# Patient Record
Sex: Female | Born: 1980 | Race: Black or African American | Hispanic: No | Marital: Married | Smoking: Current every day smoker
Health system: Southern US, Community
[De-identification: ages and names within clinical notes are randomized; demographics above are authoritative.]

## PROBLEM LIST (undated history)

## (undated) DIAGNOSIS — I10 Essential (primary) hypertension: Secondary | ICD-10-CM

## (undated) HISTORY — PX: ANKLE SURGERY: SHX546

## (undated) HISTORY — PX: ABDOMINAL HYSTERECTOMY: SHX81

---

## 2008-04-25 ENCOUNTER — Emergency Department (HOSPITAL_BASED_OUTPATIENT_CLINIC_OR_DEPARTMENT_OTHER): Admission: EM | Admit: 2008-04-25 | Discharge: 2008-04-25 | Payer: Self-pay | Admitting: Emergency Medicine

## 2009-01-07 ENCOUNTER — Emergency Department (HOSPITAL_BASED_OUTPATIENT_CLINIC_OR_DEPARTMENT_OTHER): Admission: EM | Admit: 2009-01-07 | Discharge: 2009-01-07 | Payer: Self-pay | Admitting: Emergency Medicine

## 2009-01-07 ENCOUNTER — Ambulatory Visit: Payer: Self-pay | Admitting: Radiology

## 2009-11-16 ENCOUNTER — Emergency Department (HOSPITAL_BASED_OUTPATIENT_CLINIC_OR_DEPARTMENT_OTHER): Admission: EM | Admit: 2009-11-16 | Discharge: 2009-11-16 | Payer: Self-pay | Admitting: Emergency Medicine

## 2009-11-16 ENCOUNTER — Ambulatory Visit: Payer: Self-pay | Admitting: Interventional Radiology

## 2010-07-26 LAB — URINALYSIS, ROUTINE W REFLEX MICROSCOPIC
Bilirubin Urine: NEGATIVE
Glucose, UA: NEGATIVE mg/dL
Protein, ur: NEGATIVE mg/dL
Specific Gravity, Urine: 1.019 (ref 1.005–1.030)
pH: 7 (ref 5.0–8.0)

## 2011-03-27 ENCOUNTER — Emergency Department (HOSPITAL_BASED_OUTPATIENT_CLINIC_OR_DEPARTMENT_OTHER)
Admission: EM | Admit: 2011-03-27 | Discharge: 2011-03-27 | Disposition: A | Discharge status: Home / Self Care | Payer: 59 | Attending: Emergency Medicine | Admitting: Emergency Medicine

## 2011-03-27 ENCOUNTER — Encounter: Payer: Self-pay | Admitting: *Deleted

## 2011-03-27 DIAGNOSIS — R51 Headache: Secondary | ICD-10-CM | POA: Insufficient documentation

## 2011-03-27 DIAGNOSIS — R110 Nausea: Secondary | ICD-10-CM

## 2011-03-27 HISTORY — DX: Essential (primary) hypertension: I10

## 2011-03-27 LAB — URINALYSIS, ROUTINE W REFLEX MICROSCOPIC
Bilirubin Urine: NEGATIVE
Hgb urine dipstick: NEGATIVE
Leukocytes, UA: NEGATIVE
Nitrite: NEGATIVE
Urobilinogen, UA: 0.2 mg/dL (ref 0.0–1.0)
pH: 7.5 (ref 5.0–8.0)

## 2011-03-27 LAB — BASIC METABOLIC PANEL
BUN: 10 mg/dL (ref 6–23)
Chloride: 99 mEq/L (ref 96–112)
Creatinine, Ser: 0.8 mg/dL (ref 0.50–1.10)
GFR calc Af Amer: >90 mL/min (ref >90)
GFR calc non Af Amer: >90 mL/min (ref >90)
Glucose, Bld: 87 mg/dL (ref 70–99)

## 2011-03-27 LAB — PREGNANCY, URINE: Preg Test, Ur: NEGATIVE

## 2011-03-27 MED ORDER — ONDANSETRON HCL 4 MG/2ML IJ SOLN
4.0000 mg | Freq: Once | INTRAMUSCULAR | Status: AC
  Administered 2011-03-27: 4 mg via INTRAVENOUS
  Filled 2011-03-27: qty 2

## 2011-03-27 MED ORDER — KETOROLAC TROMETHAMINE 30 MG/ML IJ SOLN
30.0000 mg | Freq: Once | INTRAMUSCULAR | Status: AC
  Administered 2011-03-27: 30 mg via INTRAVENOUS
  Filled 2011-03-27: qty 1

## 2011-03-27 MED ORDER — SODIUM CHLORIDE 0.9 % IV BOLUS (SEPSIS)
1000.0000 mL | Freq: Once | INTRAVENOUS | Status: AC
  Administered 2011-03-27: 1000 mL via INTRAVENOUS

## 2011-03-27 NOTE — ED Provider Notes (Signed)
Medical screening examination/treatment/procedure(s) were performed by non-physician practitioner and as supervising physician I was immediately available for consultation/collaboration.   Toy Baker, MD 03/27/11 [phone]

## 2011-03-27 NOTE — ED Provider Notes (Signed)
History     CSN: [id number] Arrival date & time: 03/27/2011  4:27 PM   First MD Initiated Contact with Patient 03/27/11 1654      Chief Complaint  Patient presents with  . Headache    (Consider location/radiation/quality/duration/timing/severity/associated sxs/prior treatment) HPI Comments: Pt state that she started with nauseas and feeling bad 7 days ago:pt states that she was diagnosed with a stomach virus and she just isn't feeling better:pt state that she started with the headache yesterday  Patient is a 30 y.o. female presenting with headaches. The history is provided by the patient. No language interpreter was used.  Headache  This is a new problem. The current episode started yesterday. The problem occurs constantly. The problem has not changed since onset.The headache is associated with nothing. The pain is located in the temporal region. The pain is moderate. The pain does not radiate. Associated symptoms include nausea and vomiting. Pertinent negatives include no fever. The treatment provided no relief.    Past Medical History  Diagnosis Date  . Hypertension     History reviewed. No pertinent past surgical history.  History reviewed. No pertinent family history.  History  Substance Use Topics  . Smoking status: Never Smoker   . Smokeless tobacco: Not on file  . Alcohol Use: No    OB History    Grav Para Term Preterm Abortions TAB SAB Ect Mult Living                  Review of Systems  Constitutional: Negative for fever.  Gastrointestinal: Positive for nausea and vomiting.  Neurological: Positive for headaches.  All other systems reviewed and are negative.    Allergies  Review of patient's allergies indicates no known allergies.  Home Medications   Current Outpatient Rx  Name Route Sig Dispense Refill  . HYDROCHLOROTHIAZIDE 12.5 MG PO CAPS Oral Take 12.5 mg by mouth daily.      . IBUPROFEN 200 MG PO TABS Oral Take 400 mg by mouth every 6 (six)  hours as needed. For headache      . METOPROLOL SUCCINATE ER 25 MG PO TB24 Oral Take 25 mg by mouth daily.      Marland Kitchen NISOLDIPINE 17 MG PO TB24 Oral Take 17 mg by mouth daily.      Marland Kitchen POTASSIUM GLUCONATE PO Oral Take 1 tablet by mouth daily.      Marland Kitchen PROMETHAZINE HCL 25 MG PO TABS Oral Take 25 mg by mouth every 6 (six) hours as needed. For nausea        BP 126/80  Pulse 94  Temp(Src) 98.8 F (37.1 C) (Oral)  Resp 16  Ht 5\' 9"  (1.753 m)  Wt 181 lb (82.101 kg)  BMI 26.73 kg/m2  SpO2 100%  LMP 03/14/2011  Physical Exam  Nursing note and vitals reviewed. Constitutional: She is oriented to person, place, and time. She appears well-developed and well-nourished.  HENT:  Head: Normocephalic and atraumatic.  Neck: Normal range of motion.  Cardiovascular: Normal rate and regular rhythm.   Pulmonary/Chest: Effort normal and breath sounds normal.  Musculoskeletal: Normal range of motion.  Neurological: She is alert and oriented to person, place, and time.  Skin: Skin is warm and dry.    ED Course  Procedures (including critical care time)  Labs Reviewed  URINALYSIS, ROUTINE W REFLEX MICROSCOPIC - Abnormal; Notable for the following:    Ketones, ur 40 (*)    All other components within normal limits  PREGNANCY, URINE  BASIC METABOLIC PANEL   No results found.   1. Headache   2. Nausea       MDM  Pt is feeling better at this time        Teressa Lower, NP 03/27/11 1901

## 2011-03-27 NOTE — ED Notes (Signed)
Pt states she has been sick since last Monday. Seen at [facility] ED and dx'd with stomach virus. No better. C/O headache, N/V. PERL

## 2011-04-13 ENCOUNTER — Emergency Department (HOSPITAL_BASED_OUTPATIENT_CLINIC_OR_DEPARTMENT_OTHER)
Admission: EM | Admit: 2011-04-13 | Discharge: 2011-04-13 | Disposition: A | Discharge status: Home / Self Care | Payer: 59 | Attending: Emergency Medicine | Admitting: Emergency Medicine

## 2011-04-13 ENCOUNTER — Encounter (HOSPITAL_BASED_OUTPATIENT_CLINIC_OR_DEPARTMENT_OTHER): Payer: Self-pay | Admitting: *Deleted

## 2011-04-13 DIAGNOSIS — M549 Dorsalgia, unspecified: Secondary | ICD-10-CM | POA: Insufficient documentation

## 2011-04-13 DIAGNOSIS — Z79899 Other long term (current) drug therapy: Secondary | ICD-10-CM | POA: Insufficient documentation

## 2011-04-13 DIAGNOSIS — I10 Essential (primary) hypertension: Secondary | ICD-10-CM | POA: Insufficient documentation

## 2011-04-13 MED ORDER — CYCLOBENZAPRINE HCL 5 MG PO TABS: 5.0000 mg | ORAL_TABLET | Freq: Two times a day (BID) | ORAL | Status: AC | PRN

## 2011-04-13 NOTE — ED Provider Notes (Signed)
History     CSN: [id number]  Arrival date & time 04/13/11  1659   First MD Initiated Contact with Patient 04/13/11 1841      Chief Complaint  Patient presents with  . Back Pain    (Consider location/radiation/quality/duration/timing/severity/associated sxs/prior treatment) Patient is a 31 y.o. female presenting with back pain. The history is provided by the patient. No language interpreter was used.  Back Pain  This is a new problem. The current episode started yesterday. The problem occurs constantly. The problem has not changed since onset.The pain is associated with no known injury. The pain is present in the thoracic spine. The quality of the pain is described as aching. The pain does not radiate. The pain is moderate. The symptoms are aggravated by certain positions. The pain is the same all the time. Pertinent negatives include no numbness, no bowel incontinence, no perianal numbness, no bladder incontinence, no tingling and no weakness. She has tried nothing for the symptoms.    Past Medical History  Diagnosis Date  . Hypertension     History reviewed. No pertinent past surgical history.  History reviewed. No pertinent family history.  History  Substance Use Topics  . Smoking status: Never Smoker   . Smokeless tobacco: Not on file  . Alcohol Use: No    OB History    Grav Para Term Preterm Abortions TAB SAB Ect Mult Living                  Review of Systems  Gastrointestinal: Negative for bowel incontinence.  Genitourinary: Negative for bladder incontinence.  Musculoskeletal: Positive for back pain.  Neurological: Negative for tingling, weakness and numbness.  All other systems reviewed and are negative.    Allergies  Review of patient's allergies indicates no known allergies.  Home Medications   Current Outpatient Rx  Name Route Sig Dispense Refill  . HYDROCHLOROTHIAZIDE 12.5 MG PO CAPS Oral Take 12.5 mg by mouth daily.      Marland Kitchen METOPROLOL SUCCINATE ER  25 MG PO TB24 Oral Take 25 mg by mouth daily.      Marland Kitchen NISOLDIPINE ER 17 MG PO TB24 Oral Take 17 mg by mouth daily.      Marland Kitchen POTASSIUM CHLORIDE ER 10 MEQ PO TBCR Oral Take 10 mEq by mouth daily.      . SERTRALINE HCL 50 MG PO TABS Oral Take 25 mg by mouth daily.      Marland Kitchen ZOLOFT PO Oral Take 0.5 tablets by mouth daily.      . CYCLOBENZAPRINE HCL 5 MG PO TABS Oral Take 1 tablet (5 mg total) by mouth 2 (two) times daily as needed for muscle spasms. 20 tablet 0    BP 136/79  Pulse 89  Temp(Src) 98.2 F (36.8 C) (Oral)  Resp 18  Ht 5\' 9"  (1.753 m)  Wt 181 lb (82.101 kg)  BMI 26.73 kg/m2  SpO2 100%  LMP 03/14/2011  Physical Exam  Nursing note reviewed. Constitutional: She is oriented to person, place, and time. She appears well-developed and well-nourished.  HENT:  Head: Normocephalic and atraumatic.  Cardiovascular: Normal rate and regular rhythm.   Pulmonary/Chest: Effort normal and breath sounds normal.  Abdominal: Soft.  Musculoskeletal: Normal range of motion.       Pt tender along the right scaphoid  Neurological: She is alert and oriented to person, place, and time.  Skin: Skin is warm and dry.  Psychiatric: She has a normal mood and affect.    ED  Course  Procedures (including critical care time)  Labs Reviewed - No data to display No results found.   1. Back pain       MDM  Pt tender with palpation and movement likely muscle strain:no concerned for respiratory infection        Teressa Lower, NP 04/13/11 1859

## 2011-04-13 NOTE — ED Notes (Signed)
Upper left back pain onset yesterday

## 2011-04-13 NOTE — ED Provider Notes (Signed)
Medical screening examination/treatment/procedure(s) were performed by non-physician practitioner and as supervising physician I was immediately available for consultation/collaboration.   Leelan Rajewski, MD 04/13/11 2306 

## 2015-11-09 ENCOUNTER — Emergency Department (INDEPENDENT_AMBULATORY_CARE_PROVIDER_SITE_OTHER)
Admission: EM | Admit: 2015-11-09 | Discharge: 2015-11-09 | Disposition: A | Discharge status: Home / Self Care | Payer: 59 | Source: Home / Self Care | Attending: Emergency Medicine | Admitting: Emergency Medicine

## 2015-11-09 ENCOUNTER — Encounter: Payer: Self-pay | Admitting: *Deleted

## 2015-11-09 ENCOUNTER — Emergency Department (INDEPENDENT_AMBULATORY_CARE_PROVIDER_SITE_OTHER): Payer: 59

## 2015-11-09 DIAGNOSIS — S93402A Sprain of unspecified ligament of left ankle, initial encounter: Secondary | ICD-10-CM | POA: Diagnosis not present

## 2015-11-09 DIAGNOSIS — M25472 Effusion, left ankle: Secondary | ICD-10-CM

## 2015-11-09 MED ORDER — MELOXICAM 15 MG PO TABS: 15.0000 mg | ORAL_TABLET | Freq: Every day | ORAL | 1 refills | Status: DC

## 2015-11-09 MED ORDER — HYDROCODONE-ACETAMINOPHEN 5-325 MG PO TABS: 1.0000 | ORAL_TABLET | Freq: Four times a day (QID) | ORAL | 0 refills | Status: DC | PRN

## 2015-11-09 NOTE — ED Triage Notes (Signed)
Pt c/o LT ankle pain and swelling x 2 wks after rolling her ankle after she stepped on a lego at work. She has taken IBF, applied ice and warm epsom salt soaks without relief.

## 2015-11-09 NOTE — ED Provider Notes (Signed)
[facility]    CSN: [id number] Arrival date & time: 11/09/15  1650  First Provider Contact:  None       History   Chief Complaint Chief Complaint  Patient presents with  . Ankle Pain    HPI Kristen Khan is a 35 y.o. female.   HPI 2 weeks ago, accidentally twisted her left ankle and since then has had moderate to severe pain and swelling left medial ankle without radiation. Very difficult to weight bear. No paresthesias. She has not sought medical attention until now and has been limping around since then, hoping it would improve on its own. No other associated symptoms. No chest pain or shortness of breath or fever or chills or nausea or vomiting or abdominal pain. Pertinent past medical history: Fractured this left ankle 2005, surgically repaired with metallic pins and that went well and she was asymptomatic until now Past Medical History:  Diagnosis Date  . Hypertension     There are no active problems to display for this patient.   Past Surgical History:  Procedure Laterality Date  . ABDOMINAL HYSTERECTOMY    . ANKLE SURGERY Left     OB History    No data available       Home Medications    Prior to Admission medications   Medication Sig Start Date End Date Taking? Authorizing Provider  amLODipine (NORVASC) 5 MG tablet Take 5 mg by mouth daily.   Yes Historical Provider, MD  citalopram (CELEXA) 40 MG tablet Take 40 mg by mouth daily.   Yes Historical Provider, MD  HYDROcodone-acetaminophen (NORCO/VICODIN) 5-325 MG tablet Take 1-2 tablets by mouth every 6 (six) hours as needed for severe pain. Take with food. Caution: May cause drowsiness 11/09/15   Lajean Manes, MD  meloxicam (MOBIC) 15 MG tablet Take 1 tablet (15 mg total) by mouth daily. As needed for pain and inflammation. Take with food. Caution: Do not take at the same time with other NSAIDs 11/09/15   Lajean Manes, MD    Family History Family History  Problem Relation Age of Onset  .  Hypertension Mother   . Hypertension Father   . Diabetes Father     Social History Social History  Substance Use Topics  . Smoking status: Current Every Day Smoker    Packs/day: 0.25    Types: Cigarettes  . Smokeless tobacco: Never Used  . Alcohol use Yes     Allergies   Review of patient's allergies indicates no known allergies.   Review of Systems Review of Systems  Cardiovascular: Negative.   All other systems reviewed and are negative.    Physical Exam Triage Vital Signs ED Triage Vitals  Enc Vitals Group     BP 11/09/15 1711 147/89     Pulse Rate 11/09/15 1711 79     Resp 11/09/15 1711 16     Temp 11/09/15 1711 98 F (36.7 C)     Temp Source 11/09/15 1711 Oral     SpO2 11/09/15 1711 100 %     Weight 11/09/15 1711 215 lb (97.5 kg)     Height 11/09/15 1711 5\' 9"  (1.753 m)     Head Circumference --      Peak Flow --      Pain Score 11/09/15 1715 8     Pain Loc --      Pain Edu? --      Excl. in GC? --    No data found.   Updated Vital  Signs BP 147/89 (BP Location: Left Arm)   Pulse 79   Temp 98 F (36.7 C) (Oral)   Resp 16   Ht  (1.753 m)   Wt 215 lb (97.5 kg)   LMP 03/14/2011   SpO2 100%   BMI 31.75 kg/m      Physical Exam  Constitutional: She is oriented to person, place, and time. She appears well-developed and well-nourished. No distress.  HENT:  Head: Normocephalic and atraumatic.  Eyes: Conjunctivae and EOM are normal. Pupils are equal, round, and reactive to light. No scleral icterus.  Neck: Normal range of motion.  Cardiovascular: Normal rate.   Pulmonary/Chest: Effort normal.  Abdominal: She exhibits no distension.  Musculoskeletal:       Left ankle: She exhibits decreased range of motion and swelling. She exhibits no ecchymosis, no laceration and normal pulse. Tenderness. Medial malleolus tenderness found. Achilles tendon normal.       Left foot: Normal. There is normal range of motion, no tenderness, no bony tenderness and  normal capillary refill.  Left ankle: Very swollen and tender. No instability.  Neurological: She is alert and oriented to person, place, and time.  Skin: Skin is warm.  Psychiatric: She has a normal mood and affect.  Nursing note and vitals reviewed.    UC Treatments / Results  Labs (all labs ordered are listed, but only abnormal results are displayed) Labs Reviewed - No data to display  EKG  EKG Interpretation None       Radiology No results found.  Procedures Procedures (including critical care time)  Medications Ordered in UC Medications - No data to display   Initial Impression / Assessment and Plan / UC Course  I have reviewed the triage vital signs and the nursing notes.  Pertinent labs & imaging results that were available during my care of the patient were reviewed by me and considered in my medical decision making (see chart for details).  Clinical Course    Likely has severe sprain medial aspect left ankle. No fracture seen on x-ray. Prior screws and pins from prior surgery in 2005 are intact.  Final Clinical Impressions(s) / UC Diagnoses   Final diagnoses:  Left ankle sprain, initial encounter  Treatment options discussed, as well as risks, benefits, alternatives. Patient voiced understanding and agreement with the following plans: Vicodin when necessary severe pain. No back for moderate pain. Left ankle boot applied. Limit weightbearing for now. Advised crutches. Other symptomatic care discussed Follow-up with orthopedist or sports medicine within one week. An After Visit Summary was printed and given to the patient. Questions invited and answered. She voiced understanding and agreement with above  New Prescriptions Discharge Medication List as of 11/09/2015  6:05 PM    START taking these medications   Details  HYDROcodone-acetaminophen (NORCO/VICODIN) 5-325 MG tablet Take 1-2 tablets by mouth every 6 (six) hours as needed for severe pain. Take  with food. Caution: May cause drowsiness, Starting Mon 11/09/2015, Print    meloxicam (MOBIC) 15 MG tablet Take 1 tablet (15 mg total) by mouth daily. As needed for pain and inflammation. Take with food. Caution: Do not take at the same time with other NSAIDs, Starting Mon 11/09/2015, Print         Lajean Manes, MD 11/12/15 1350

## 2015-11-09 NOTE — Discharge Instructions (Signed)
Your x-ray showed no sign of fracture.  Likely have severe ankle sprain that may take many more weeks to heal up. Avoid excessive weightbearing. Use crutches as needed. Keep left leg elevated intermittently. Wear the ankle boot as instructed

## 2015-11-26 ENCOUNTER — Encounter: Payer: Self-pay | Admitting: Sports Medicine

## 2015-11-26 ENCOUNTER — Ambulatory Visit (INDEPENDENT_AMBULATORY_CARE_PROVIDER_SITE_OTHER): Payer: 59 | Admitting: Sports Medicine

## 2015-11-26 DIAGNOSIS — S96912A Strain of unspecified muscle and tendon at ankle and foot level, left foot, initial encounter: Secondary | ICD-10-CM

## 2015-11-26 NOTE — Progress Notes (Signed)
   Subjective:    I'm seeing this patient as a consultation for:  Dr. Lajean Manesavid Massey  CC: Left ankle pain  HPI: This is a pleasant 35 year old female, she has a history of a ORIF post tibial and fibular fracture 5 years ago. Unfortunately a month ago she stepped on a Lego and rolled her ankle, not sure which direction. She now has severe pain and swelling behind the medial malleolus, making it difficult to walk, moderate, persistent. No bruising.  Past medical history, Surgical history, Family history not pertinant except as noted below, Social history, Allergies, and medications have been entered into the medical record, reviewed, and no changes needed.   Review of Systems: No headache, visual changes, nausea, vomiting, diarrhea, constipation, dizziness, abdominal pain, skin rash, fevers, chills, night sweats, weight loss, swollen lymph nodes, body aches, joint swelling, muscle aches, chest pain, shortness of breath, mood changes, visual or auditory hallucinations.   Objective:   General: Well Developed, well nourished, and in no acute distress.  Neuro/Psych: Alert and oriented x3, extra-ocular muscles intact, able to move all 4 extremities, sensation grossly intact. Skin: Warm and dry, no rashes noted.  Respiratory: Not using accessory muscles, speaking in full sentences, trachea midline.  Cardiovascular: Pulses palpable, no extremity edema. Abdomen: Does not appear distended. Left Ankle: Visibly swollen behind the medial malleolus Range of motion is full in all directions. Strength is 5/5 in all directions. Stable lateral and medial ligaments; squeeze test and kleiger test unremarkable; Talar dome nontender; No pain at base of 5th MT; No tenderness over cuboid; No tenderness over N spot or navicular prominence Tender behind the medial malleolus with reproduction of pain with resisted inversion, no pain with resisted plantar flexion of the great toe or the small toes. No sign of  peroneal tendon subluxations; Negative tarsal tunnel tinel's  Short-leg walking cast placed  Impression and Recommendations:   This case required medical decision making of moderate complexity.  Left tibialis posterior strain Pain present for one month now, x-rays are negative, she is post ORIF of the tibia and fibula fracture years ago. Pain is referable to the tibialis posterior, she does refuse a boot so we are going to place her in a cast. Return in 4 weeks. We will probably need some physical therapy after that.

## 2015-11-26 NOTE — Assessment & Plan Note (Signed)
Pain present for one month now, x-rays are negative, she is post ORIF of the tibia and fibula fracture years ago. Pain is referable to the tibialis posterior, she does refuse a boot so we are going to place her in a cast. Return in 4 weeks. We will probably need some physical therapy after that.

## 2015-12-24 ENCOUNTER — Ambulatory Visit (INDEPENDENT_AMBULATORY_CARE_PROVIDER_SITE_OTHER): Payer: 59 | Admitting: Sports Medicine

## 2015-12-24 ENCOUNTER — Encounter: Payer: Self-pay | Admitting: Sports Medicine

## 2015-12-24 DIAGNOSIS — S96912A Strain of unspecified muscle and tendon at ankle and foot level, left foot, initial encounter: Secondary | ICD-10-CM

## 2015-12-24 MED ORDER — MELOXICAM 15 MG PO TABS: ORAL_TABLET | ORAL | 3 refills | Status: DC

## 2015-12-24 NOTE — Assessment & Plan Note (Signed)
Cast is removed. Still with some pain behind the medial malleolus. We are quite proceed to MRI, she has already had an x-ray. I'm also going to set her up with formal physical therapy. Adding meloxicam.  If insufficient response by the next month we will do a tibialis posterior tendon sheath injection.

## 2015-12-24 NOTE — Progress Notes (Signed)
  Subjective:    CC: Follow-up  HPI: This is a pleasant 35 year old female with a left tibialis posterior strain. She has been in a cast for 4 weeks now, still with some pain.  Past medical history, Surgical history, Family history not pertinant except as noted below, Social history, Allergies, and medications have been entered into the medical record, reviewed, and no changes needed.   Review of Systems: No fevers, chills, night sweats, weight loss, chest pain, or shortness of breath.   Objective:    General: Well Developed, well nourished, and in no acute distress.  Neuro: Alert and oriented x3, extra-ocular muscles intact, sensation grossly intact.  HEENT: Normocephalic, atraumatic, pupils equal round reactive to light, neck supple, no masses, no lymphadenopathy, thyroid nonpalpable.  Skin: Warm and dry, no rashes. Cardiac: Regular rate and rhythm, no murmurs rubs or gallops, no lower extremity edema.  Respiratory: Clear to auscultation bilaterally. Not using accessory muscles, speaking in full sentences. Left ankle: Cast is removed, still with minimal tenderness but not much swelling behind immediate malleolus.  Impression and Recommendations:    Left tibialis posterior strain Cast is removed. Still with some pain behind the medial malleolus. We are quite proceed to MRI, she has already had an x-ray. I'm also going to set her up with formal physical therapy. Adding meloxicam.  If insufficient response by the next month we will do a tibialis posterior tendon sheath injection.  I spent 25 minutes with this patient, greater than 50% was face-to-face time counseling regarding the above diagnoses

## 2016-01-11 ENCOUNTER — Ambulatory Visit (INDEPENDENT_AMBULATORY_CARE_PROVIDER_SITE_OTHER): Payer: 59

## 2016-01-11 DIAGNOSIS — M65872 Other synovitis and tenosynovitis, left ankle and foot: Secondary | ICD-10-CM | POA: Diagnosis not present

## 2016-01-20 ENCOUNTER — Ambulatory Visit: Payer: 59 | Admitting: Rehabilitative and Restorative Service Providers"

## 2016-10-24 ENCOUNTER — Emergency Department (INDEPENDENT_AMBULATORY_CARE_PROVIDER_SITE_OTHER): Payer: 59

## 2016-10-24 ENCOUNTER — Emergency Department (INDEPENDENT_AMBULATORY_CARE_PROVIDER_SITE_OTHER)
Admission: EM | Admit: 2016-10-24 | Discharge: 2016-10-24 | Disposition: A | Discharge status: Home / Self Care | Payer: 59 | Source: Home / Self Care | Attending: Family Medicine | Admitting: Family Medicine

## 2016-10-24 DIAGNOSIS — Z90710 Acquired absence of both cervix and uterus: Secondary | ICD-10-CM | POA: Diagnosis not present

## 2016-10-24 DIAGNOSIS — S96912A Strain of unspecified muscle and tendon at ankle and foot level, left foot, initial encounter: Secondary | ICD-10-CM

## 2016-10-24 DIAGNOSIS — R1031 Right lower quadrant pain: Secondary | ICD-10-CM

## 2016-10-24 DIAGNOSIS — R102 Pelvic and perineal pain: Secondary | ICD-10-CM

## 2016-10-24 LAB — POCT CBC W AUTO DIFF (K'VILLE URGENT CARE)

## 2016-10-24 LAB — POCT URINALYSIS DIP (MANUAL ENTRY)
BILIRUBIN UA: NEGATIVE
BILIRUBIN UA: NEGATIVE mg/dL
Blood, UA: NEGATIVE
GLUCOSE UA: NEGATIVE mg/dL
Leukocytes, UA: NEGATIVE
Nitrite, UA: NEGATIVE
Protein Ur, POC: NEGATIVE mg/dL
SPEC GRAV UA: 1.025 (ref 1.010–1.025)
Urobilinogen, UA: 0.2 E.U./dL
pH, UA: 6.5 (ref 5.0–8.0)

## 2016-10-24 MED ORDER — KETOROLAC TROMETHAMINE 60 MG/2ML IM SOLN
60.0000 mg | Freq: Once | INTRAMUSCULAR | Status: AC
  Administered 2016-10-24: 60 mg via INTRAMUSCULAR

## 2016-10-24 MED ORDER — MELOXICAM 15 MG PO TABS: ORAL_TABLET | ORAL | 1 refills | Status: DC

## 2016-10-24 NOTE — ED Triage Notes (Signed)
Pt stated that pain began on Saturday.  Has had an ovarian cyst before, and feels the same.  Has had cramping, but denies bleeding.  Has been taking ibuprofen for the discomfort.

## 2016-10-24 NOTE — ED Provider Notes (Signed)
[facility]    CSN: [id number] Arrival date & time: 10/24/16  1426     History   Chief Complaint Chief Complaint  Patient presents with  . Abdominal Pain    lower right    HPI Kristen Khan is a 36 y.o. female.   Patient complains of onset of right lower quadrant abdominal pain about 48 hours ago, now worse.  The pain does not radiate, but is worse with walking and movement.  She has had chills but no fever.  She has had mild nausea without vomiting.  No urinary symptoms.  She has a past history of abdominal hysterectomy, although her ovaries remain.  She has a past history of ovarian cysts, and she notes that her present pain is similar.   The history is provided by the patient.  Abdominal Pain  Pain location:  RLQ Pain quality: sharp   Pain radiates to:  Does not radiate Pain severity:  Moderate Onset quality:  Sudden Duration:  2 days Timing:  Intermittent Progression:  Worsening Chronicity:  New Context: previous surgery   Context: not awakening from sleep, not diet changes, not eating, not recent illness, not recent travel, not sick contacts, not suspicious food intake and not trauma   Relieved by:  Nothing Worsened by:  Movement Ineffective treatments:  NSAIDs Associated symptoms: anorexia, chills, fatigue and nausea   Associated symptoms: no belching, no chest pain, no constipation, no cough, no diarrhea, no dysuria, no fever, no flatus, no hematemesis, no hematochezia, no hematuria, no melena, no shortness of breath, no sore throat, no vaginal bleeding, no vaginal discharge and no vomiting     Past Medical History:  Diagnosis Date  . Hypertension     Patient Active Problem List   Diagnosis Date Noted  . Left tibialis posterior strain 11/26/2015    Past Surgical History:  Procedure Laterality Date  . ABDOMINAL HYSTERECTOMY    . ANKLE SURGERY Left     OB History    No data available       Home Medications    Prior to Admission  medications   Medication Sig Start Date End Date Taking? Authorizing Provider  amLODipine (NORVASC) 5 MG tablet Take 5 mg by mouth daily.    [provider]  citalopram (CELEXA) 40 MG tablet Take 40 mg by mouth daily.    [provider]  meloxicam (MOBIC) 15 MG tablet One tab PO qAM with breakfast for 2 weeks, then daily prn pain. 12/24/15   Monica Becton, MD    Family History Family History  Problem Relation Age of Onset  . Hypertension Mother   . Hypertension Father   . Diabetes Father     Social History Social History  Substance Use Topics  . Smoking status: Current Every Day Smoker    Packs/day: 0.25    Types: Cigarettes  . Smokeless tobacco: Never Used  . Alcohol use Yes     Allergies   Patient has no known allergies.   Review of Systems Review of Systems  Constitutional: Positive for chills and fatigue. Negative for fever.  HENT: Negative for sore throat.   Respiratory: Negative for cough and shortness of breath.   Cardiovascular: Negative for chest pain.  Gastrointestinal: Positive for abdominal pain, anorexia and nausea. Negative for constipation, diarrhea, flatus, hematemesis, hematochezia, melena and vomiting.  Genitourinary: Negative for dysuria, hematuria, vaginal bleeding and vaginal discharge.  All other systems reviewed and are negative.    Physical Exam Triage Vital  Signs ED Triage Vitals  Enc Vitals Group     BP 10/24/16 1444 (!) 149/98     Pulse Rate 10/24/16 1444 89     Resp --      Temp 10/24/16 1444 98.8 F (37.1 C)     Temp Source 10/24/16 1444 Oral     SpO2 10/24/16 1444 100 %     Weight 10/24/16 1444 214 lb (97.1 kg)     Height 10/24/16 1444 5\' 10"  (1.778 m)     Head Circumference --      Peak Flow --      Pain Score 10/24/16 1445 8     Pain Loc --      Pain Edu? --      Excl. in GC? --    No data found.   Updated Vital Signs BP (!) 149/98 (BP Location: Left Arm)   Pulse 89   Temp 98.8 F (37.1 C)  (Oral)   Ht 5\' 10"  (1.778 m)   Wt 214 lb (97.1 kg)   LMP 03/14/2011   SpO2 100%   BMI 30.71 kg/m   Visual Acuity Right Eye Distance:   Left Eye Distance:   Bilateral Distance:    Right Eye Near:   Left Eye Near:    Bilateral Near:     Physical Exam  Constitutional: She appears well-developed and well-nourished. No distress.  HENT:  Head: Normocephalic.  Right Ear: External ear normal.  Left Ear: External ear normal.  Nose: Nose normal.  Mouth/Throat: Oropharynx is clear and moist.  Eyes: Pupils are equal, round, and reactive to light. Conjunctivae are normal.  Neck: Neck supple.  Cardiovascular: Normal heart sounds.   Pulmonary/Chest: Breath sounds normal.  Abdominal: Soft. Normal appearance and bowel sounds are normal. She exhibits no mass. There is no hepatosplenomegaly. There is tenderness in the right lower quadrant. There is tenderness at McBurney's point. There is no rigidity, no rebound, no guarding, no CVA tenderness and negative Murphy's sign. No hernia.    There is tenderness to palpation in the right lower quadrant as noted on diagram.  There tenderness to palpation during flexion of the abdominus rectus muscles.  No masses.  Iliopsoas and obdurator tests are mildly positive.  Musculoskeletal: She exhibits no edema.  Lymphadenopathy:    She has no cervical adenopathy.  Neurological: She is alert.  Skin: Skin is warm and dry. No rash noted.  Nursing note and vitals reviewed.    UC Treatments / Results  Labs (all labs ordered are listed, but only abnormal results are displayed) Labs Reviewed  POCT CBC W AUTO DIFF ([facility]):  WBC 6.3; LY 29.9; MO 6.3; GR 63.8; Hgb 12.3; Platelets 204   POCT URINALYSIS DIP (MANUAL ENTRY) negative    EKG  EKG Interpretation None       Radiology US Transvaginal Non-OB (Accession [id number]) (Order [id number])  Imaging  Date: 10/24/2016 Department: [facility] Kathryne Sharper Released  By/Authorizing: Lattie Haw, MD (auto-released)  Exam Information   Status Exam Begun  Exam Ended   Final [99] 10/24/2016 3:46 PM 10/24/2016 3:47 PM  PACS Images   Show images for US Transvaginal Non-OB  Study Result   CLINICAL DATA:  Initial evaluation for acute right lower quadrant pain for 3 days. History of prior abdominal hysterectomy.  EXAM: TRANSABDOMINAL AND TRANSVAGINAL ULTRASOUND OF PELVIS  TECHNIQUE: Both transabdominal and transvaginal ultrasound examinations of the pelvis were performed. Transabdominal technique was performed for global imaging of the  pelvis including uterus, ovaries, adnexal regions, and pelvic cul-de-sac. It was necessary to proceed with endovaginal exam following the transabdominal exam to visualize the pelvic viscera.  COMPARISON:  None  FINDINGS: Uterus  Surgically absent.  No abnormality seen at the vaginal cuff.  Endometrium  Surgically absent.  Right ovary  Measurements: 3.0 x 2.2 x 2.1 cm. Normal appearance/no adnexal mass.  Left ovary  Measurements: 3.0 x 1.8 x 1.6 cm. Normal appearance/no adnexal mass.  Other findings  No abnormal free fluid.  IMPRESSION: 1. Normal sonographic appearance of the ovaries bilaterally. No acute abnormality within the pelvis. 2. Status post hysterectomy.  The   Electronically Signed   By: Rise MuBenjamin  McClintock M.D.   On: 10/24/2016 16:01     Procedures Procedures (including critical care time)  Medications Ordered in UC Medications  ketorolac (TORADOL) injection 60 mg (60 mg Intramuscular Given 10/24/16 1508)     Initial Impression / Assessment and Plan / UC Course  I have reviewed the triage vital signs and the nursing notes.  Pertinent labs & imaging results that were available during my care of the patient were reviewed by me and considered in my medical decision making (see chart for details).    Normal WBC, urinalysis, and negative pelvic ultrasound  reassuring.  Although pelvic ultrasound negative, suspect small ruptured ovarian cyst. Administered Toradol 60mg  IM.  Begin Mobic 15mg  QAM. May take Tylenol Extra Strength 500mg , two tabs at bedtime for pain. If symptoms become significantly worse during the night or over the weekend, proceed to the local emergency room.  Followup with Family Doctor if not improved in one week.     Final Clinical Impressions(s) / UC Diagnoses   Final diagnoses:  Pelvic pain    New Prescriptions New Prescriptions   No medications on file     Lattie HawBeese, Stephen A, MD 10/28/16 [phone]

## 2016-10-24 NOTE — Discharge Instructions (Signed)
May take Tylenol Extra Strength 500mg , two tabs at bedtime for pain. If symptoms become significantly worse during the night or over the weekend, proceed to the local emergency room.

## 2016-10-24 NOTE — ED Notes (Signed)
To US

## 2016-10-28 ENCOUNTER — Telehealth: Payer: Self-pay | Admitting: Emergency Medicine

## 2016-10-28 NOTE — Telephone Encounter (Signed)
LM, hope she is feeling better, please give us a call back to let us know how youre feeling

## 2017-03-28 ENCOUNTER — Emergency Department (INDEPENDENT_AMBULATORY_CARE_PROVIDER_SITE_OTHER)
Admission: EM | Admit: 2017-03-28 | Discharge: 2017-03-28 | Disposition: A | Discharge status: Home / Self Care | Payer: 59 | Source: Home / Self Care | Attending: Family Medicine | Admitting: Family Medicine

## 2017-03-28 ENCOUNTER — Encounter: Payer: Self-pay | Admitting: *Deleted

## 2017-03-28 ENCOUNTER — Other Ambulatory Visit: Payer: Self-pay

## 2017-03-28 DIAGNOSIS — R69 Illness, unspecified: Secondary | ICD-10-CM | POA: Diagnosis not present

## 2017-03-28 DIAGNOSIS — J111 Influenza due to unidentified influenza virus with other respiratory manifestations: Secondary | ICD-10-CM

## 2017-03-28 LAB — POCT INFLUENZA A/B
Influenza A, POC: NEGATIVE
Influenza B, POC: NEGATIVE

## 2017-03-28 LAB — POCT RAPID STREP A (OFFICE): Rapid Strep A Screen: NEGATIVE

## 2017-03-28 MED ORDER — ACETAMINOPHEN 325 MG PO TABS
650.0000 mg | ORAL_TABLET | Freq: Once | ORAL | Status: AC
  Administered 2017-03-28: 650 mg via ORAL

## 2017-03-28 NOTE — ED Provider Notes (Signed)
Kristen Khan    CSN: [id number] Arrival date & time: 03/28/17  1532     History   Chief Complaint Chief Complaint  Patient presents with  . Fever  . Sore Throat    HPI Kristen Khan is a 36 y.o. female.   HPI  Kristen Khan is a 36 y.o. female presenting to UC with c/o sudden onset fever, chills, body aches, sore throat and HA. She took ibuprofen around 11:30AM this morning with minimal relief.  She works around [employer] and has sent some home with fevers but does not know what was causing their fevers.  Denies n/v/d. Denies cough or congestion.    Past Medical History:  Diagnosis Date  . Hypertension     Patient Active Problem List   Diagnosis Date Noted  . Left tibialis posterior strain 11/26/2015    Past Surgical History:  Procedure Laterality Date  . ABDOMINAL HYSTERECTOMY    . ANKLE SURGERY Left     OB History    No data available       Home Medications    Prior to Admission medications   Medication Sig Start Date End Date Taking? Authorizing Provider  amLODipine (NORVASC) 5 MG tablet Take 5 mg by mouth daily.   Yes [provider]  citalopram (CELEXA) 40 MG tablet Take 40 mg by mouth daily.   Yes [provider]  meloxicam (MOBIC) 15 MG tablet One tab PO qAM with breakfast 10/24/16   Lattie HawBeese, Stephen A, MD    Family History Family History  Problem Relation Age of Onset  . Hypertension Mother   . Hypertension Father   . Diabetes Father     Social History Social History   Tobacco Use  . Smoking status: Current Every Day Smoker    Packs/day: 0.25    Types: Cigarettes  . Smokeless tobacco: Never Used  Substance Use Topics  . Alcohol use: Yes  . Drug use: No     Allergies   Patient has no known allergies.   Review of Systems Review of Systems  Constitutional: Positive for appetite change, chills, fatigue and fever.  HENT: Positive for ear pain (Right) and sore throat. Negative for congestion, trouble  swallowing and voice change.   Respiratory: Negative for cough and shortness of breath.   Cardiovascular: Negative for chest pain and palpitations.  Gastrointestinal: Negative for abdominal pain, diarrhea, nausea and vomiting.  Musculoskeletal: Positive for arthralgias, back pain and myalgias.       Body aches  Skin: Negative for rash.  Neurological: Positive for headaches. Negative for dizziness and light-headedness.     Physical Exam Triage Vital Signs ED Triage Vitals  Enc Vitals Group     BP 03/28/17 1604 (!) 158/95     Pulse Rate 03/28/17 1604 (!) 109     Resp 03/28/17 1604 18     Temp 03/28/17 1604 100.1 F (37.8 C)     Temp Source 03/28/17 1604 Oral     SpO2 03/28/17 1604 98 %     Weight 03/28/17 1607 218 lb (98.9 kg)     Height 03/28/17 1607 5\' 9"  (1.753 m)     Head Circumference --      Peak Flow --      Pain Score 03/28/17 1607 0     Pain Loc --      Pain Edu? --      Excl. in GC? --    No data found.  Updated Vital Signs BP Marland Kitchen(!)  158/95 (BP Location: Left Arm)   Pulse (!) 109   Temp 100.1 F (37.8 C) (Oral)   Resp 18   Ht 5\' 9"  (1.753 m)   Wt 218 lb (98.9 kg)   LMP 03/14/2011   SpO2 98%   BMI 32.19 kg/m   Visual Acuity Right Eye Distance:   Left Eye Distance:   Bilateral Distance:    Right Eye Near:   Left Eye Near:    Bilateral Near:     Physical Exam  Constitutional: She is oriented to person, place, and time. She appears well-developed and well-nourished.  Non-toxic appearance. She appears ill. No distress.  Lying on exam bed, appears unwell but is alert and cooperative during exam.  HENT:  Head: Normocephalic and atraumatic.  Right Ear: Tympanic membrane normal.  Left Ear: Tympanic membrane normal.  Nose: Nose normal. Right sinus exhibits no maxillary sinus tenderness and no frontal sinus tenderness. Left sinus exhibits no maxillary sinus tenderness and no frontal sinus tenderness.  Mouth/Throat: Uvula is midline and mucous membranes are  normal. Posterior oropharyngeal erythema present. No oropharyngeal exudate, posterior oropharyngeal edema or tonsillar abscesses.  Eyes: EOM are normal.  Neck: Normal range of motion.  Cardiovascular: Regular rhythm. Tachycardia present.  Mild tachycardia  Pulmonary/Chest: Effort normal and breath sounds normal. No stridor. No respiratory distress. She has no wheezes. She has no rhonchi.  Musculoskeletal: Normal range of motion.  Neurological: She is alert and oriented to person, place, and time.  Skin: Skin is warm and dry.  Psychiatric: She has a normal mood and affect. Her behavior is normal.  Nursing note and vitals reviewed.    UC Treatments / Results  Labs (all labs ordered are listed, but only abnormal results are displayed) Labs Reviewed  POCT RAPID STREP A (OFFICE)  POCT INFLUENZA A/B    EKG  EKG Interpretation None       Radiology No results found.  Procedures Procedures (including critical Khan time)  Medications Ordered in UC Medications  acetaminophen (TYLENOL) tablet 650 mg (650 mg Oral Given 03/28/17 1605)     Initial Impression / Assessment and Plan / UC Course  I have reviewed the triage vital signs and the nursing notes.  Pertinent labs & imaging results that were available during my Khan of the patient were reviewed by me and considered in my medical decision making (see chart for details).    Rapid strep and flu: NEGATIVE  Symptoms c/w viral illness Encouraged symptomatic treatment F/u with PCP in 1 week if not improving.   Final Clinical Impressions(s) / UC Diagnoses   Final diagnoses:  None    ED Discharge Orders    None       Controlled Substance Prescriptions [place] Controlled Substance Registry consulted? Not Applicable   Lurene Shadowhelps, Izayiah Tibbitts O, PA-C 03/28/17 1640

## 2017-03-28 NOTE — ED Triage Notes (Signed)
Pt c/o sore throat, fever, body aches, and HA x today. Last dose IBF 1130 today.

## 2017-03-28 NOTE — Discharge Instructions (Signed)
°  You may take 500mg acetaminophen every 4-6 hours or in combination with ibuprofen 400-600mg every 6-8 hours as needed for pain, inflammation, and fever. ° °Be sure to drink at least eight 8oz glasses of water to stay well hydrated and get at least 8 hours of sleep at night, preferably more while sick.  ° °

## 2018-06-26 ENCOUNTER — Other Ambulatory Visit: Payer: Self-pay

## 2018-06-26 ENCOUNTER — Emergency Department (INDEPENDENT_AMBULATORY_CARE_PROVIDER_SITE_OTHER)
Admission: EM | Admit: 2018-06-26 | Discharge: 2018-06-26 | Disposition: A | Discharge status: Home / Self Care | Payer: 59 | Source: Home / Self Care

## 2018-06-26 ENCOUNTER — Emergency Department (INDEPENDENT_AMBULATORY_CARE_PROVIDER_SITE_OTHER): Payer: 59

## 2018-06-26 DIAGNOSIS — R51 Headache: Secondary | ICD-10-CM

## 2018-06-26 DIAGNOSIS — M542 Cervicalgia: Secondary | ICD-10-CM

## 2018-06-26 DIAGNOSIS — M62838 Other muscle spasm: Secondary | ICD-10-CM

## 2018-06-26 DIAGNOSIS — R519 Headache, unspecified: Secondary | ICD-10-CM

## 2018-06-26 DIAGNOSIS — G44209 Tension-type headache, unspecified, not intractable: Secondary | ICD-10-CM

## 2018-06-26 MED ORDER — ONDANSETRON 4 MG PO TBDP: ORAL_TABLET | ORAL | 0 refills | Status: AC
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
## 2018-06-26 NOTE — Discharge Instructions (Addendum)
°  You may continue taking the medication prescribed by the hospital this weekend.  Please see additional information in this packet for home exercises and other treatments you can try at home for your pain.discomfort.  Please call to schedule an appointment with Sports Medicine for further evaluation and treatment of severe neck pain/stiffness that is contributing to your tension headache.
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
## 2022-02-16 ENCOUNTER — Emergency Department
Admission: EM | Admit: 2022-02-16 | Discharge: 2022-02-16 | Disposition: A | Discharge status: Home / Self Care | Payer: 59 | Attending: Emergency Medicine | Admitting: Emergency Medicine

## 2022-02-16 ENCOUNTER — Encounter: Payer: Self-pay | Admitting: Emergency Medicine

## 2022-02-16 ENCOUNTER — Other Ambulatory Visit: Payer: Self-pay

## 2022-02-16 DIAGNOSIS — N3000 Acute cystitis without hematuria: Secondary | ICD-10-CM | POA: Insufficient documentation

## 2022-02-16 DIAGNOSIS — X58XXXA Exposure to other specified factors, initial encounter: Secondary | ICD-10-CM | POA: Insufficient documentation

## 2022-02-16 DIAGNOSIS — S3992XA Unspecified injury of lower back, initial encounter: Secondary | ICD-10-CM | POA: Diagnosis present

## 2022-02-16 DIAGNOSIS — S39012A Strain of muscle, fascia and tendon of lower back, initial encounter: Secondary | ICD-10-CM | POA: Diagnosis not present

## 2022-02-16 LAB — BASIC METABOLIC PANEL
Anion gap: 8 (ref 5–15)
BUN: 11 mg/dL (ref 6–20)
CO2: 23 mmol/L (ref 22–32)
Calcium: 9.5 mg/dL (ref 8.9–10.3)
Chloride: 106 mmol/L (ref 98–111)
Creatinine, Ser: 0.97 mg/dL (ref 0.44–1.00)
GFR, Estimated: >60 mL/min (ref >60)
Glucose, Bld: 117 mg/dL — ABNORMAL HIGH (ref 70–99)
Potassium: 3.6 mmol/L (ref 3.5–5.1)
Sodium: 137 mmol/L (ref 135–145)

## 2022-02-16 LAB — URINALYSIS, ROUTINE W REFLEX MICROSCOPIC
Bilirubin Urine: NEGATIVE
Glucose, UA: NEGATIVE mg/dL
Ketones, ur: NEGATIVE mg/dL
Leukocytes,Ua: NEGATIVE
Nitrite: NEGATIVE
Protein, ur: 30 mg/dL — AB
Specific Gravity, Urine: 1.027 (ref 1.005–1.030)
pH: 5 (ref 5.0–8.0)

## 2022-02-16 LAB — CBC
HCT: 37.8 % (ref 36.0–46.0)
Hemoglobin: 12.5 g/dL (ref 12.0–15.0)
MCH: 26.8 pg (ref 26.0–34.0)
MCHC: 33.1 g/dL (ref 30.0–36.0)
MCV: 81.1 fL (ref 80.0–100.0)
Platelets: 212 10*3/uL (ref 150–400)
RBC: 4.66 MIL/uL (ref 3.87–5.11)
RDW: 13 % (ref 11.5–15.5)
WBC: 8.8 10*3/uL (ref 4.0–10.5)
nRBC: 0 % (ref 0.0–0.2)

## 2022-02-16 LAB — POC URINE PREG, ED: Preg Test, Ur: NEGATIVE

## 2022-02-16 MED ORDER — MELOXICAM 15 MG PO TABS: 15.0000 mg | ORAL_TABLET | Freq: Every day | ORAL | 0 refills | Status: AC

## 2022-02-16 MED ORDER — MELOXICAM 7.5 MG PO TABS
15.0000 mg | ORAL_TABLET | Freq: Once | ORAL | Status: AC
  Administered 2022-02-16: 15 mg via ORAL
  Filled 2022-02-16 (×2): qty 2

## 2022-02-16 MED ORDER — CEFTRIAXONE SODIUM 1 G IJ SOLR
1.0000 g | Freq: Once | INTRAMUSCULAR | Status: AC
  Administered 2022-02-16: 1 g via INTRAMUSCULAR
  Filled 2022-02-16 (×2): qty 10

## 2022-02-16 MED ORDER — METHOCARBAMOL 500 MG PO TABS
1000.0000 mg | ORAL_TABLET | Freq: Once | ORAL | Status: AC
  Administered 2022-02-16: 1000 mg via ORAL
  Filled 2022-02-16 (×2): qty 2

## 2022-02-16 MED ORDER — CEPHALEXIN 500 MG PO CAPS: 500.0000 mg | ORAL_CAPSULE | Freq: Four times a day (QID) | ORAL | 0 refills | Status: AC

## 2022-02-16 MED ORDER — METHOCARBAMOL 500 MG PO TABS: 500.0000 mg | ORAL_TABLET | Freq: Four times a day (QID) | ORAL | 0 refills | Status: AC

## 2022-02-16 NOTE — ED Triage Notes (Signed)
Patient to ED for back pain- right sided down into leg. Patient states she also has some abd cramping, urinary frequency, and burning with urination.

## 2022-02-16 NOTE — ED Provider Notes (Signed)
[facility] Provider Note  Patient Contact: 5:25 PM (approximate)   History   Back Pain   HPI  Kristen Khan is a 41 y.o. female who presents the emergency department for 2 complaints.  Patient started to have some back pain roughly a week ago.  Bilateral slightly worse on the right than left.  No other associated symptoms with radicular pain, urinary changes at the time.  Roughly 3 days ago she started to develop some burning with urination.  No fevers, chills, hematuria.  No nausea, vomiting, diarrhea.     Physical Exam   Triage Vital Signs: ED Triage Vitals [02/16/22 1629]  Enc Vitals Group     BP 138/85     Pulse Rate (!) 106     Resp 18     Temp 99.6 F (37.6 C)     Temp Source Oral     SpO2 98 %     Weight      Height      Head Circumference      Peak Flow      Pain Score 10     Pain Loc      Pain Edu?      Excl. in GC?     Most recent vital signs: Vitals:   02/16/22 1629  BP: 138/85  Pulse: (!) 106  Resp: 18  Temp: 99.6 F (37.6 C)  SpO2: 98%     General: Alert and in no acute distress.  Cardiovascular:  Good peripheral perfusion Respiratory: Normal respiratory effort without tachypnea or retractions. Lungs CTAB.  Gastrointestinal: Bowel sounds 4 quadrants. Soft and nontender to palpation. No guarding or rigidity. No palpable masses. No distention. Mild R CVA tenderness. Musculoskeletal: Full range of motion to all extremities.  No visible abnormality about the spine.  Tender midline and bilateral paraspinal muscle groups worse on the right than left.  No palpable abnormality or step-off.  No SI tenderness Neurologic:  No gross focal neurologic deficits are appreciated.  Skin:   No rash noted Other:   ED Results / Procedures / Treatments   Labs (all labs ordered are listed, but only abnormal results are displayed) Labs Reviewed  URINALYSIS, ROUTINE W REFLEX MICROSCOPIC - Abnormal; Notable for the following  components:      Result Value   Color, Urine YELLOW (*)    APPearance HAZY (*)    Hgb urine dipstick SMALL (*)    Protein, ur 30 (*)    Bacteria, UA RARE (*)    All other components within normal limits  BASIC METABOLIC PANEL - Abnormal; Notable for the following components:   Glucose, Bld 117 (*)    All other components within normal limits  CBC  POC URINE PREG, ED     EKG     RADIOLOGY    No results found.  PROCEDURES:  Critical Care performed: No  Procedures   MEDICATIONS ORDERED IN ED: Medications  cefTRIAXone (ROCEPHIN) injection 1 g (has no administration in time range)  meloxicam (MOBIC) tablet 15 mg (has no administration in time range)  methocarbamol (ROBAXIN) tablet 1,000 mg (has no administration in time range)     IMPRESSION / MDM / ASSESSMENT AND PLAN / ED COURSE  I reviewed the triage vital signs and the nursing notes.                              Differential diagnosis includes, but is  not limited to, UTI, pyelonephritis, nephrolithiasis, lumbar strain, herniated disc  Patient's presentation is most consistent with acute presentation with potential threat to life or bodily function.   Patient's diagnosis is consistent with UTI, lumbar strain.  Patient presents the emergency department with low back pain and urinary symptoms.  Patient had low back pain prior to the onset of any UTI symptoms.  No fever, chills, no CVA tenderness.  Patient will have anti-inflammatory muscle relaxer for her lumbar pain, she is having some dysuria with mild bacteria in her urine we will treat with antibiotics for UTI.  Concerning signs and symptoms and return precautions discussed with the patient.  Labs are otherwise reassuring.  No indication for further work-up.. P Patient is given ED precautions to return to the ED for any worsening or new symptoms.        FINAL CLINICAL IMPRESSION(S) / ED DIAGNOSES   Final diagnoses:  Strain of lumbar region, initial  encounter  Acute cystitis without hematuria     Rx / DC Orders   ED Discharge Orders          Ordered    meloxicam (MOBIC) 15 MG tablet  Daily        02/16/22 2105    methocarbamol (ROBAXIN) 500 MG tablet  4 times daily        02/16/22 2105    cephALEXin (KEFLEX) 500 MG capsule  4 times daily        02/16/22 2105             Note:  This document was prepared using Dragon voice recognition software and may include unintentional dictation errors.   Lanette Hampshire 02/16/22 2105    Georga Hacking, MD 02/16/22 2322

## 2022-02-16 NOTE — ED Notes (Signed)
Pt reports low back pain for the past 2 weeks, pt states some burning with urination and a low grade fever, denies any known injury to her back

## 2023-03-29 ENCOUNTER — Encounter: Payer: Self-pay | Admitting: Emergency Medicine

## 2023-03-29 ENCOUNTER — Emergency Department: Payer: 59

## 2023-03-29 ENCOUNTER — Emergency Department
Admission: EM | Admit: 2023-03-29 | Discharge: 2023-03-29 | Disposition: A | Discharge status: Home / Self Care | Payer: 59 | Attending: Emergency Medicine | Admitting: Emergency Medicine

## 2023-03-29 ENCOUNTER — Other Ambulatory Visit: Payer: Self-pay

## 2023-03-29 DIAGNOSIS — S99922A Unspecified injury of left foot, initial encounter: Secondary | ICD-10-CM | POA: Diagnosis present

## 2023-03-29 DIAGNOSIS — I10 Essential (primary) hypertension: Secondary | ICD-10-CM | POA: Diagnosis not present

## 2023-03-29 DIAGNOSIS — W228XXA Striking against or struck by other objects, initial encounter: Secondary | ICD-10-CM | POA: Insufficient documentation

## 2023-03-29 DIAGNOSIS — S92512A Displaced fracture of proximal phalanx of left lesser toe(s), initial encounter for closed fracture: Secondary | ICD-10-CM | POA: Diagnosis not present

## 2023-03-29 DIAGNOSIS — S92912A Unspecified fracture of left toe(s), initial encounter for closed fracture: Secondary | ICD-10-CM

## 2023-03-29 MED ORDER — OXYCODONE HCL 5 MG PO TABS: 5.0000 mg | ORAL_TABLET | Freq: Four times a day (QID) | ORAL | 0 refills | Status: AC | PRN

## 2023-03-29 MED ORDER — OXYCODONE HCL 5 MG PO TABS
5.0000 mg | ORAL_TABLET | Freq: Once | ORAL | Status: AC
  Administered 2023-03-29: 5 mg via ORAL
  Filled 2023-03-29: qty 1

## 2023-03-29 NOTE — ED Triage Notes (Signed)
Pt to triage via w/c with no distress noted; reports PTA was walking and caught her left 4th toe on edge of kitchen [place]; c/o pain to site

## 2023-03-29 NOTE — Discharge Instructions (Addendum)
Take tylenol 1g every 8 hours and ibuprofen 600 every 8 hours for pain. Take oxycodone for breakthrough pain F/u podiatry- call to make appointment. Return to ER for worsening pain or any other concerns.  Take oxycodone as prescribed. Do not drink alcohol, drive or participate in any other potentially dangerous activities while taking this medication as it may make you sleepy. Do not take this medication with any other sedating medications, either prescription or over-the-counter. If you were prescribed Percocet or Vicodin, do not take these with acetaminophen (Tylenol) as it is already contained within these medications.  This medication is an opiate (or narcotic) pain medication and can be habit forming. Use it as little as possible to achieve adequate pain control. Do not use or use it with extreme caution if you have a history of opiate abuse or dependence. If you are on a pain contract with your primary care doctor or a pain specialist, be sure to let them know you were prescribed this medication today from the Emergency Department. This medication is intended for your use only - do not give any to anyone else and keep it in a secure place where nobody else, especially children, have access to it.
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
## 2024-05-07 ENCOUNTER — Other Ambulatory Visit: Payer: Self-pay | Admitting: Sports Medicine

## 2024-05-07 DIAGNOSIS — S93491D Sprain of other ligament of right ankle, subsequent encounter: Secondary | ICD-10-CM

## 2024-05-07 DIAGNOSIS — M25471 Effusion, right ankle: Secondary | ICD-10-CM

## 2024-05-07 DIAGNOSIS — W108XXD Fall (on) (from) other stairs and steps, subsequent encounter: Secondary | ICD-10-CM
